# Patient Record
Sex: Male | Born: 1980 | Race: Black or African American | Hispanic: No | Marital: Married | Smoking: Current every day smoker
Health system: Southern US, Community
[De-identification: ages and names within clinical notes are randomized; demographics above are authoritative.]

---
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
## 2002-03-07 ENCOUNTER — Emergency Department (HOSPITAL_COMMUNITY): Admission: EM | Admit: 2002-03-07 | Discharge: 2002-03-07 | Payer: Self-pay | Admitting: Emergency Medicine

## 2002-03-07 ENCOUNTER — Encounter: Payer: Self-pay | Admitting: Emergency Medicine

## 2009-07-10 ENCOUNTER — Ambulatory Visit: Payer: Self-pay | Admitting: Radiology

## 2009-07-10 ENCOUNTER — Emergency Department (HOSPITAL_BASED_OUTPATIENT_CLINIC_OR_DEPARTMENT_OTHER): Admission: EM | Admit: 2009-07-10 | Discharge: 2009-07-10 | Payer: Self-pay | Admitting: Emergency Medicine

## 2010-08-15 ENCOUNTER — Emergency Department (HOSPITAL_BASED_OUTPATIENT_CLINIC_OR_DEPARTMENT_OTHER): Admission: EM | Admit: 2010-08-15 | Discharge: 2010-08-15 | Payer: Self-pay | Admitting: Emergency Medicine

## 2011-01-28 LAB — GC/CHLAMYDIA PROBE AMP, GENITAL: Chlamydia, DNA Probe: NEGATIVE

## 2011-02-18 IMAGING — CR DG FOOT COMPLETE 3+V*R*
3 series · 3 of 3 positions shown · non-contrast
Comparison: None available.

CLINICAL DATA: Lateral foot pain.  Injury.

RIGHT FOOT COMPLETE - 3+ VIEW

[t foot ap right]
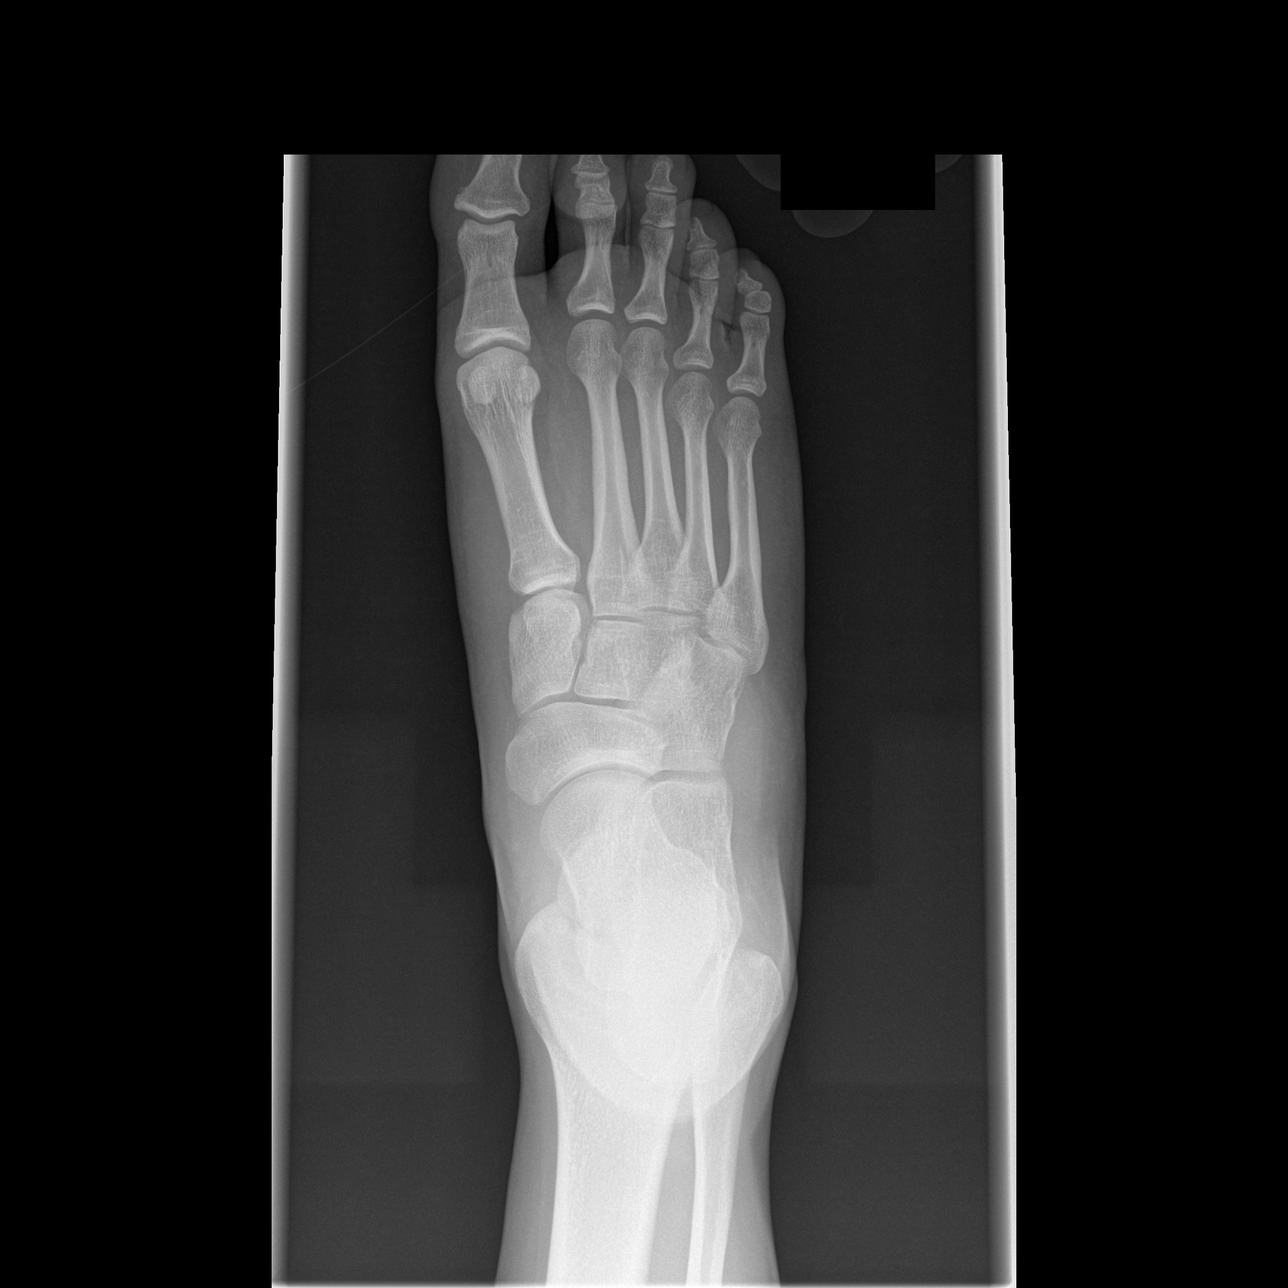

[t foot oblique right]
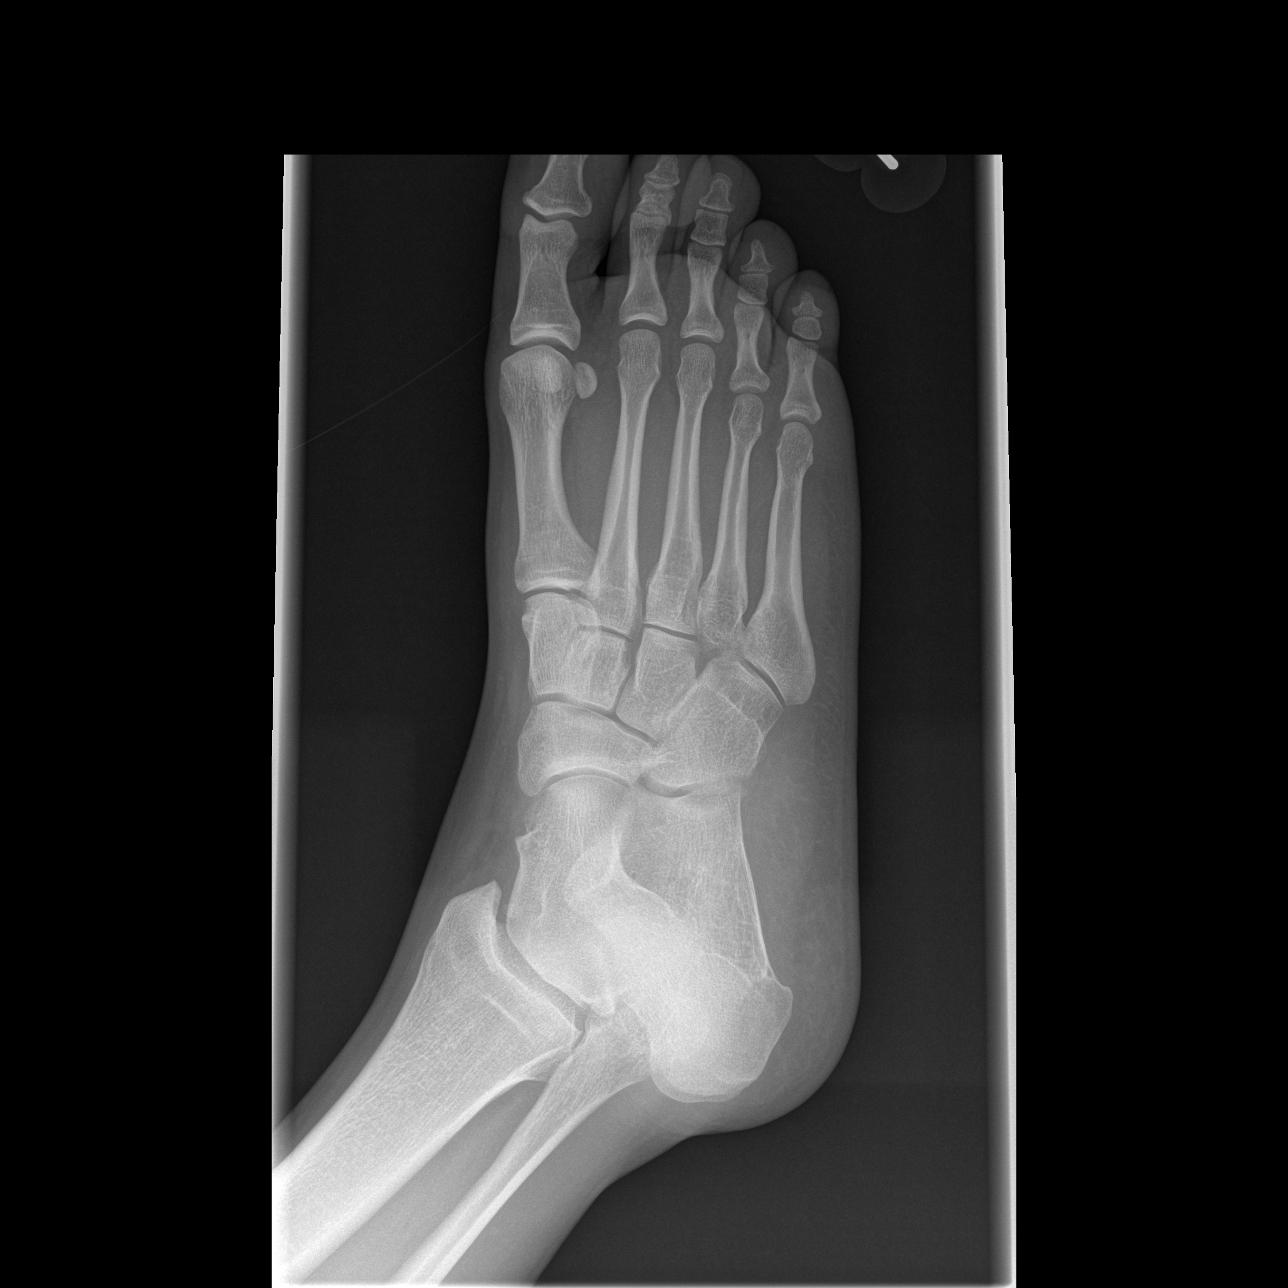

[t foot lat right]
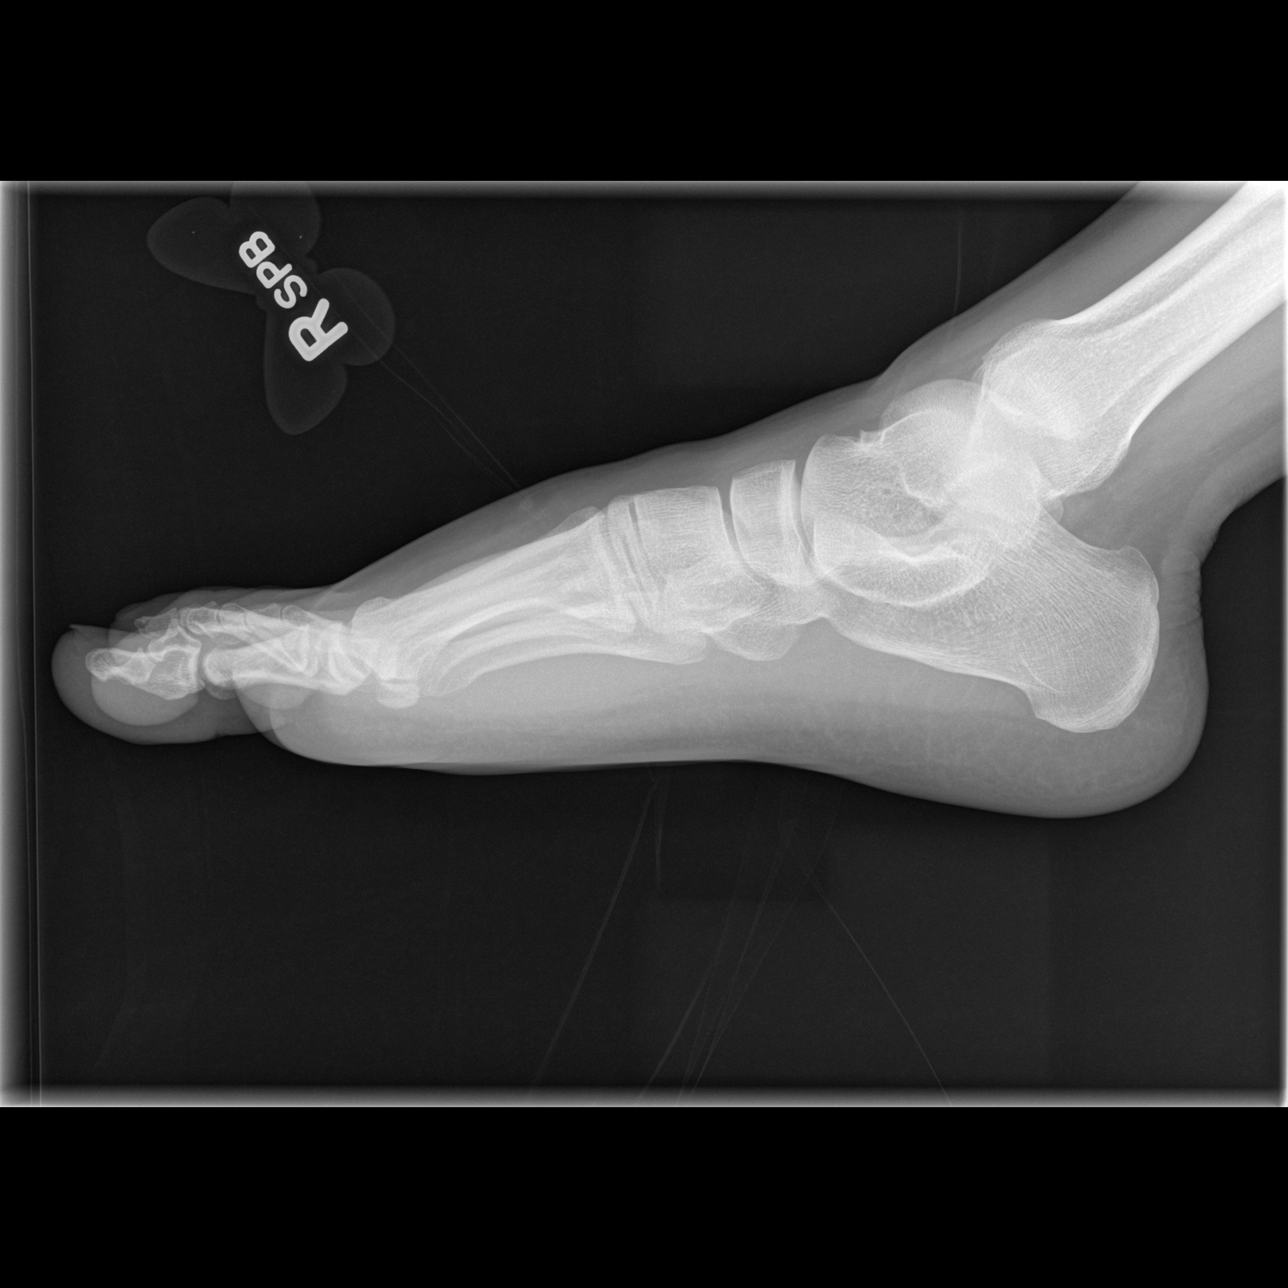

[3 of 3 positions shown; findings below may reference images not displayed]

FINDINGS: No acute bone or soft tissue abnormalities are present.
The ankle joint is located.
IMPRESSION: No acute abnormality.

## 2014-09-04 ENCOUNTER — Encounter (HOSPITAL_BASED_OUTPATIENT_CLINIC_OR_DEPARTMENT_OTHER): Payer: Self-pay | Admitting: Emergency Medicine

## 2014-09-04 ENCOUNTER — Emergency Department (HOSPITAL_BASED_OUTPATIENT_CLINIC_OR_DEPARTMENT_OTHER)
Admission: EM | Admit: 2014-09-04 | Discharge: 2014-09-04 | Disposition: A | Discharge status: Home / Self Care | Payer: Managed Care, Other (non HMO) | Attending: Emergency Medicine | Admitting: Emergency Medicine

## 2014-09-04 ENCOUNTER — Emergency Department (HOSPITAL_BASED_OUTPATIENT_CLINIC_OR_DEPARTMENT_OTHER): Payer: Managed Care, Other (non HMO)

## 2014-09-04 DIAGNOSIS — M25461 Effusion, right knee: Secondary | ICD-10-CM | POA: Insufficient documentation

## 2014-09-04 DIAGNOSIS — Z720 Tobacco use: Secondary | ICD-10-CM | POA: Insufficient documentation

## 2014-09-04 DIAGNOSIS — R52 Pain, unspecified: Secondary | ICD-10-CM

## 2014-09-04 DIAGNOSIS — M25561 Pain in right knee: Secondary | ICD-10-CM | POA: Diagnosis present

## 2014-09-04 LAB — SYNOVIAL CELL COUNT + DIFF, W/ CRYSTALS
Crystals, Fluid: NONE SEEN
Eosinophils-Synovial: 0 % (ref 0–1)
LYMPHOCYTES-SYNOVIAL FLD: 1 % (ref 0–20)
Monocyte-Macrophage-Synovial Fluid: 3 % — ABNORMAL LOW (ref 50–90)
Neutrophil, Synovial: 96 % — ABNORMAL HIGH (ref 0–25)
Other Cells-SYN: 0
WBC, Synovial: 6450 /mm3 — ABNORMAL HIGH (ref 0–200)

## 2014-09-04 MED ORDER — OXYCODONE-ACETAMINOPHEN 5-325 MG PO TABS
2.0000 | ORAL_TABLET | Freq: Once | ORAL | Status: AC
  Administered 2014-09-04: 2 via ORAL
  Filled 2014-09-04: qty 2

## 2014-09-04 MED ORDER — LIDOCAINE HCL 2 % IJ SOLN
10.0000 mL | Freq: Once | INTRAMUSCULAR | Status: DC
  Filled 2014-09-04: qty 20

## 2014-09-04 MED ORDER — OXYCODONE-ACETAMINOPHEN 5-325 MG PO TABS: 1.0000 | ORAL_TABLET | Freq: Four times a day (QID) | ORAL | Status: DC | PRN

## 2014-09-04 NOTE — ED Notes (Signed)
Woke up Monday morning - 2 days ago - with right knee pain.  No known injury.

## 2014-09-04 NOTE — Discharge Instructions (Signed)
Take percocet for severe pain only. No driving or operating heavy machinery while taking percocet. This medication may cause drowsiness. ° °Knee Effusion °The medical term for having fluid in your knee is effusion. This is often due to an internal derangement of the knee. This means something is wrong inside the knee. Some of the causes of fluid in the knee may be torn cartilage, a torn ligament, or bleeding into the joint from an injury. Your knee is likely more difficult to bend and move. This is often because there is increased pain and pressure in the joint. The time it takes for recovery from a knee effusion depends on different factors, including:  °· Type of injury. °· Your age. °· Physical and medical conditions. °· Rehabilitation Strategies. °How long you will be away from your normal activities will depend on what kind of knee problem you have and how much damage is present. Your knee has two types of cartilage. Articular cartilage covers the bone ends and lets your knee bend and move smoothly. Two menisci, thick pads of cartilage that form a rim inside the joint, help absorb shock and stabilize your knee. Ligaments bind the bones together and support your knee joint. Muscles move the joint, help support your knee, and take stress off the joint itself. °CAUSES  °Often an effusion in the knee is caused by an injury to one of the menisci. This is often a tear in the cartilage. Recovery after a meniscus injury depends on how much meniscus is damaged and whether you have damaged other knee tissue. Small tears may heal on their own with conservative treatment. Conservative means rest, limited weight bearing activity and muscle strengthening exercises. Your recovery may take up to 6 weeks.  °TREATMENT  °Larger tears may require surgery. Meniscus injuries may be treated during arthroscopy. Arthroscopy is a procedure in which your surgeon uses a small telescope like instrument to look in your knee. Your caregiver  can make a more accurate diagnosis (learning what is wrong) by performing an arthroscopic procedure. °If your injury is on the inner margin of the meniscus, your surgeon may trim the meniscus back to a smooth rim. In other cases your surgeon will try to repair a damaged meniscus with stitches (sutures). This may make rehabilitation take longer, but may provide better long term result by helping your knee keep its shock absorption capabilities. °Ligaments which are completely torn usually require surgery for repair. °HOME CARE INSTRUCTIONS °· Use crutches as instructed. °· If a brace is applied, use as directed. °· Once you are home, an ice pack applied to your swollen knee may help with discomfort and help decrease swelling. °· Keep your knee raised (elevated) when you are not up and around or on crutches. °· Only take over-the-counter or prescription medicines for pain, discomfort, or fever as directed by your caregiver. °· Your caregivers will help with instructions for rehabilitation of your knee. This often includes strengthening exercises. °· You may resume a normal diet and activities as directed. °SEEK MEDICAL CARE IF:  °· There is increased swelling in your knee. °· You notice redness, swelling, or increasing pain in your knee. °· An unexplained oral temperature above 102° F (38.9° C) develops. °SEEK IMMEDIATE MEDICAL CARE IF:  °· You develop a rash. °· You have difficulty breathing. °· You have any allergic reactions from medications you may have been given. °· There is severe pain with any motion of the knee. °MAKE SURE YOU:  °· Understand these   instructions.  Will watch your condition.  Will get help right away if you are not doing well or get worse. Document Released: 01/22/2004 Document Revised: 01/24/2012 Document Reviewed: 03/27/2008 [facility] Patient Information 2015 [place], [place]. This information is not intended to replace advice given to you by your health care provider. Make sure you  discuss any questions you have with your health care provider.  Knee Arthrocentesis Arthrocentesis is a procedure for removing fluid from a joint. The procedure is used to remove uncomfortable amounts of fluid from a joint or to get a sample of joint fluid for testing. Testing joint fluid can help your health care provider figure out the cause of the pain or swelling you are having in your joint. Infection or gout, among other conditions, can cause fluid to form in joints, resulting in pain or swelling.  LET YOUR CAREGIVER KNOW ABOUT:   Allergies.  Medications taken including herbs, eye drops, over the counter medications, and creams.  Use of steroids (by mouth or creams).  Previous problems with anesthetics or Novocaine.  History of blood clots (thrombophlebitis).  History of bleeding or blood problems.  Previous surgery.  Other health problems. RISKS AND COMPLICATIONS  A local anesthetic may not numb the area well enough and you may feel some minor discomfort. In rare cases, you may have an allergic reaction to the drug used to numb the skin.  More fluid may form in the joint.  You may develop infection or bleeding. BEFORE THE PROCEDURE  Wash all of the skin around the entire knee area. Try to remove any loose, scaling skin. There is no other specific preparation necessary unless advised otherwise by your caregiver. AFTER THE PROCEDURE   You can go home after the procedure.  You may need to put ice on the joint 15-20 minutes, 03-04 times a day until pain goes away.  You may need to put an elastic bandage on the joint. HOME CARE INSTRUCTIONS   Only take over-the-counter or prescription medicines for pain, discomfort, or fever as directed by your caregiver.  You should avoid stressing the joint. Unless advised otherwise, this includes jogging, bicycling, recreational climbing, hiking, and other activities that would put a lot of pressure on a knee joint.  Laying down and  elevating the leg/knee above the level of your heart can help to minimize return of swelling. SEEK MEDICAL CARE IF:   You have repeated or worsening swelling.  There is drainage from the puncture area.  You develop red streaking that extends above or below the site where the needle had been placed. SEEK IMMEDIATE MEDICAL CARE IF:   You develop a fever.  You have pain that gets worse even though you are taking pain medicine.  The area is red and warm and you have trouble moving the joint. MAKE SURE YOU:   Understand these instructions.  Will watch your condition.  Will get help right away if you are not doing well or get worse. Document Released: 01/20/2007 Document Revised: 01/24/2012 Document Reviewed: 10/16/2007 [facility]Exit[facility] Patient Information 2015 [place], [place]. This information is not intended to replace advice given to you by your health care provider. Make sure you discuss any questions you have with your health care provider.

## 2014-09-04 NOTE — ED Notes (Signed)
Patient transported to [facility] 

## 2014-09-04 NOTE — ED Provider Notes (Signed)
Medical screening examination/treatment/procedure(s) were performed by non-physician practitioner and as supervising physician I was immediately available for consultation/collaboration.   Warnell Foresterrey Ella Guillotte, MD 09/04/14 [phone]

## 2014-09-04 NOTE — ED Provider Notes (Signed)
CSN: [id number]     Arrival date & time 09/04/14  1833 History   First MD Initiated Contact with Patient 09/04/14 1842     Chief Complaint  Patient presents with  . Knee Pain     (Consider location/radiation/quality/duration/timing/severity/associated sxs/prior Treatment) HPI Comments: This is a 33 year old male who presents to the emergency department complaining of right knee pain x2 days. Patient reports 2 mornings ago he woke up with pain. Pain worse with movement and walking. He has not tried any alleviating factors for his symptoms. No known injury or trauma. Denies ever injuring his right knee in the past. Denies fever, chills, rash or skin color change.  Patient is a 33 y.o. male presenting with knee pain. The history is provided by the patient.  Knee Pain Associated symptoms: no fever     History reviewed. No pertinent past medical history. History reviewed. No pertinent past surgical history. No family history on file. History  Substance Use Topics  . Smoking status: Current Every Day Smoker -- 0.50 packs/day  . Smokeless tobacco: Not on file  . Alcohol Use: No    Review of Systems  Constitutional: Negative for fever.  HENT: Negative.   Respiratory: Negative.   Cardiovascular: Negative.   Gastrointestinal: Negative for nausea.  Musculoskeletal: Positive for joint swelling.       + R knee pain.  Skin: Negative for color change.      Allergies  Review of patient's allergies indicates no known allergies.  Home Medications   Prior to Admission medications   Medication Sig Start Date End Date Taking? Authorizing Provider  oxyCODONE-acetaminophen (PERCOCET) 5-325 MG per tablet Take 1-2 tablets by mouth every 6 (six) hours as needed for severe pain. 09/04/14   Sharda Keddy M Marlyss Cissell, PA-C   BP 154/97  Pulse 66  Temp(Src) 98.4 F (36.9 C) (Oral)  Resp 18  SpO2 100% Physical Exam  Nursing note and vitals reviewed. Constitutional: He is oriented to person, place, and  time. He appears well-developed and well-nourished. No distress.  HENT:  Head: Normocephalic and atraumatic.  Eyes: Conjunctivae and EOM are normal.  Neck: Normal range of motion. Neck supple.  Cardiovascular: Normal rate, regular rhythm and normal heart sounds.   Pulmonary/Chest: Effort normal and breath sounds normal.  Musculoskeletal:  Right knee- tender to palpation medial to the patella with mild swelling. No erythema or warmth. Full range of motion, pain with flexion. No ligamentous laxity.  Neurological: He is alert and oriented to person, place, and time.  Skin: Skin is warm and dry.  Psychiatric: He has a normal mood and affect. His behavior is normal.    ED Course  ARTHOCENTESIS Date/Time: 09/04/2014 7:12 PM Performed by: Kathrynn SpeedHESS, Mindy Gali M Authorized by: Kathrynn SpeedHESS, Rema Lievanos M Consent: Verbal consent obtained. Consent given by: patient Patient understanding: patient states understanding of the procedure being performed Indications: joint swelling and pain  Body area: knee Joint: right knee Local anesthesia used: yes Anesthesia: local infiltration Local anesthetic: lidocaine 2% without epinephrine Anesthetic total: 1 ml Patient sedated: no Preparation: Patient was prepped and draped in the usual sterile fashion. Needle gauge: 18 G Ultrasound guidance: no Approach: medial Aspirate: bloody Aspirate amount: 4 ml Lidocaine 2% amount: 4 ml   (including critical care time) Labs Review Labs Reviewed  BODY FLUID CULTURE  SYNOVIAL CELL COUNT + DIFF, W/ CRYSTALS    Imaging Review Dg Knee 1-2 Views Right  09/04/2014   CLINICAL DATA:  33 year old male who awoke with acute right knee pain.  No known injury. Initial encounter.  EXAM: RIGHT KNEE - 1-2 VIEW  COMPARISON:  None.  FINDINGS: Moderate suprapatellar joint effusion. Dystrophic suprapatellar joint space calcifications (lateral view). Patella intact. No acute fracture or dislocation. Other tiny calcific foci projected in the medial  and lateral compartments on the frontal view.  IMPRESSION: Joint effusion and multiple calcified loose bodies suspected. No acute fracture or dislocation identified about the right knee.   Electronically Signed   By: Augusto GambleLee  Hall M.D.   On: 09/04/2014 19:02     EKG Interpretation None      MDM   Final diagnoses:  Knee effusion, right   Patient with knee pain and joint effusion. Afebrile, vital signs stable. He is in no apparent distress. No erythema or warmth concerning for infection. Doubt gout. Bloody joint fluid withdrawn. Possible ligamentous injury. Knee immobilizer applied, crutches given. Will discharge with pain medication. Followup with orthopedics. Stable for discharge. Return precautions given. Parent states understanding of plan and is agreeable.  Kathrynn SpeedRobyn M Kyheem Bathgate, PA-C 09/04/14 [phone]

## 2014-09-06 ENCOUNTER — Other Ambulatory Visit: Payer: Self-pay | Admitting: Family Medicine

## 2014-09-06 ENCOUNTER — Encounter: Payer: Self-pay | Admitting: Family Medicine

## 2014-09-06 ENCOUNTER — Ambulatory Visit (INDEPENDENT_AMBULATORY_CARE_PROVIDER_SITE_OTHER): Payer: Managed Care, Other (non HMO) | Admitting: Family Medicine

## 2014-09-06 VITALS — BP 145/92 | HR 76 | Ht 66.0 in | Wt 170.0 lb

## 2014-09-06 DIAGNOSIS — M25561 Pain in right knee: Secondary | ICD-10-CM

## 2014-09-06 DIAGNOSIS — M25461 Effusion, right knee: Secondary | ICD-10-CM

## 2014-09-06 MED ORDER — METHYLPREDNISOLONE ACETATE 40 MG/ML IJ SUSP
40.0000 mg | Freq: Once | INTRAMUSCULAR | Status: AC
  Administered 2014-09-06: 40 mg via INTRA_ARTICULAR

## 2014-09-06 MED ORDER — HYDROCODONE-ACETAMINOPHEN 5-325 MG PO TABS: 1.0000 | ORAL_TABLET | Freq: Four times a day (QID) | ORAL | Status: AC | PRN

## 2014-09-06 NOTE — Patient Instructions (Signed)
You have a knee effusion. We aspirated and injected your knee today. Ice knee 15 minutes at a time 3-4 times a day. Ibuprofen 600mg  three times a day OR aleve 2 tabs twice a day with food for pain and inflammation. Norco every 6 hours as needed for severe pain. Knee brace as needed for support. Crutches if needed. Call me in 1-2 weeks to let me know how you're doing.

## 2014-09-07 LAB — SYNOVIAL CELL COUNT + DIFF, W/ CRYSTALS
Crystals, Fluid: NONE SEEN
Eosinophils-Synovial: 0 % (ref 0–1)
Lymphocytes-Synovial Fld: 5 % (ref 0–20)
Monocyte/Macrophage: 18 % — ABNORMAL LOW (ref 50–90)
NEUTROPHIL, SYNOVIAL: 77 % — AB (ref 0–25)
WBC, Synovial: 4065 cu mm — ABNORMAL HIGH (ref 0–200)

## 2014-09-08 LAB — BODY FLUID CULTURE
Culture: NO GROWTH
SPECIAL REQUESTS: NORMAL

## 2014-09-11 ENCOUNTER — Encounter: Payer: Self-pay | Admitting: Family Medicine

## 2014-09-11 DIAGNOSIS — M25561 Pain in right knee: Secondary | ICD-10-CM | POA: Insufficient documentation

## 2014-09-11 NOTE — Progress Notes (Signed)
Patient ID: Kirk White, male   DOB: December 24, 1980, 33 y.o.   MRN: [id number]  [facility]  Subjective:   HPI: Patient is a 33 y.o. male here for right knee swelling.  Patient reports he's previously had issues with swelling, pain in right knee that comes and goes. No other joints involved. Current issue started 10/20 when he woke up with right knee swelling and pain. No known injury. Came to ED and had radiographs showing an effusion, probable loose bodies in knee. Had knee drained - reports this was medial and only able to obtain 4mL of fluid. Analysis showed no crystals but 6450 WBCs with 96% PMNs. No catching or locking of knee. No remote injury.  History reviewed. No pertinent past medical history.  No current outpatient prescriptions on file prior to visit.   No current facility-administered medications on file prior to visit.    History reviewed. No pertinent past surgical history.  No Known Allergies  History   Social History  . Marital Status: Married    Spouse Name: N/A    Number of Children: N/A  . Years of Education: N/A   Occupational History  . Not on file.   Social History Main Topics  . Smoking status: Current Every Day Smoker -- 0.50 packs/day  . Smokeless tobacco: Not on file  . Alcohol Use: No  . Drug Use: No  . Sexual Activity: Not on file   Other Topics Concern  . Not on file   Social History Narrative  . No narrative on file    No family history on file.  BP 145/92  Pulse 76  Ht 5\' 6"  (1.676 m)  Wt 170 lb (77.111 kg)  BMI 27.45 kg/m2  Review of Systems: See HPI above.    Objective:  Physical Exam:  Gen: NAD  Right knee: Mod effusion.  No bruising, other deformity. TTP lateral suprapatellar area, some medial joint line.  No other tenderness. FROM. Negative ant/post drawers. Negative valgus/varus testing. Negative lachmanns. Negative mcmurrays, apleys, patellar apprehension. NV intact distally.    Assessment &  Plan:  1. Right knee pain/effusion - Radiographs showed multiple probable loose bodies though he does not recall an injury.  His aspiration was likely unsuccessful - we repeated this today superolaterally and obtained 38mL of fluid - will send to lab.  Followed this with a cortisone injection.  Icing, nsaids with norco as needed.  Will call him with fluid results.  If improves quite a bit with cortisone injection would suspect this is an inflammatory arthropathy.  If not, he will likely need arthroscopy to try to remove these loose bodies.  Call us in 1-2 weeks to update us on how he is doing.  Knee brace for support with crutches as needed.  After informed written consent patient was lying supine on exam table.  Right knee was prepped with alcohol swab.  Utilizing superolateral approach under ultrasound guidance, 3 mL of marcaine was used for local anesthesia.  Then using an 18g needle on 60cc syringe, 38 mL of red colored fluid was aspirated from right knee.  Knee was then injected with 3:1 marcaine:depomedrol.  Patient tolerated procedure well without immediate complications.

## 2014-09-11 NOTE — Assessment & Plan Note (Signed)
Radiographs showed multiple probable loose bodies though he does not recall an injury.  His aspiration was likely unsuccessful - we repeated this today superolaterally and obtained 38mL of fluid - will send to lab.  Followed this with a cortisone injection.  Icing, nsaids with norco as needed.  Will call him with fluid results.  If improves quite a bit with cortisone injection would suspect this is an inflammatory arthropathy.  If not, he will likely need arthroscopy to try to remove these loose bodies.  Call us in 1-2 weeks to update us on how he is doing.  Knee brace for support with crutches as needed.  After informed written consent patient was lying supine on exam table.  Right knee was prepped with alcohol swab.  Utilizing superolateral approach under ultrasound guidance, 3 mL of marcaine was used for local anesthesia.  Then using an 18g needle on 60cc syringe, 38 mL of red colored fluid was aspirated from right knee.  Knee was then injected with 3:1 marcaine:depomedrol.  Patient tolerated procedure well without immediate complications.

## 2016-04-14 IMAGING — CR DG KNEE 1-2V*R*
2 series · 2 of 2 positions shown · non-contrast
Comparison: None.

CLINICAL DATA: 33-year-old male who awoke with acute right knee
pain. No known injury. Initial encounter.

EXAM:
RIGHT KNEE - 1-2 VIEW

[t knee ap right]
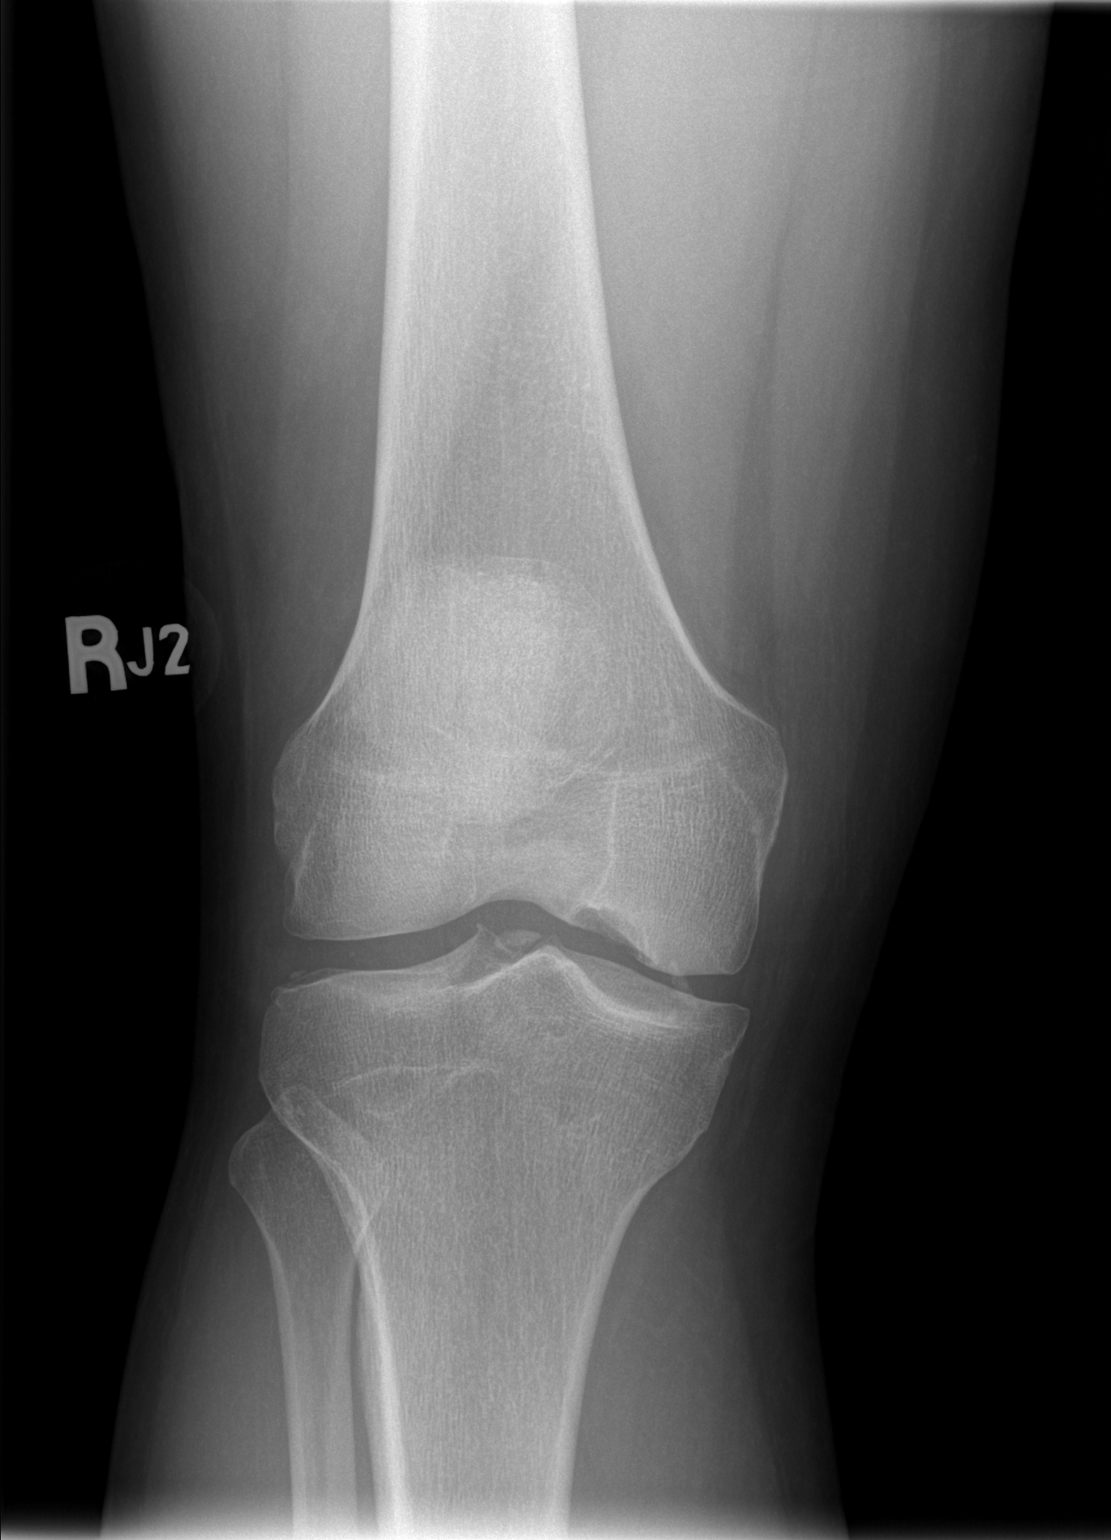

[t knee lat right]
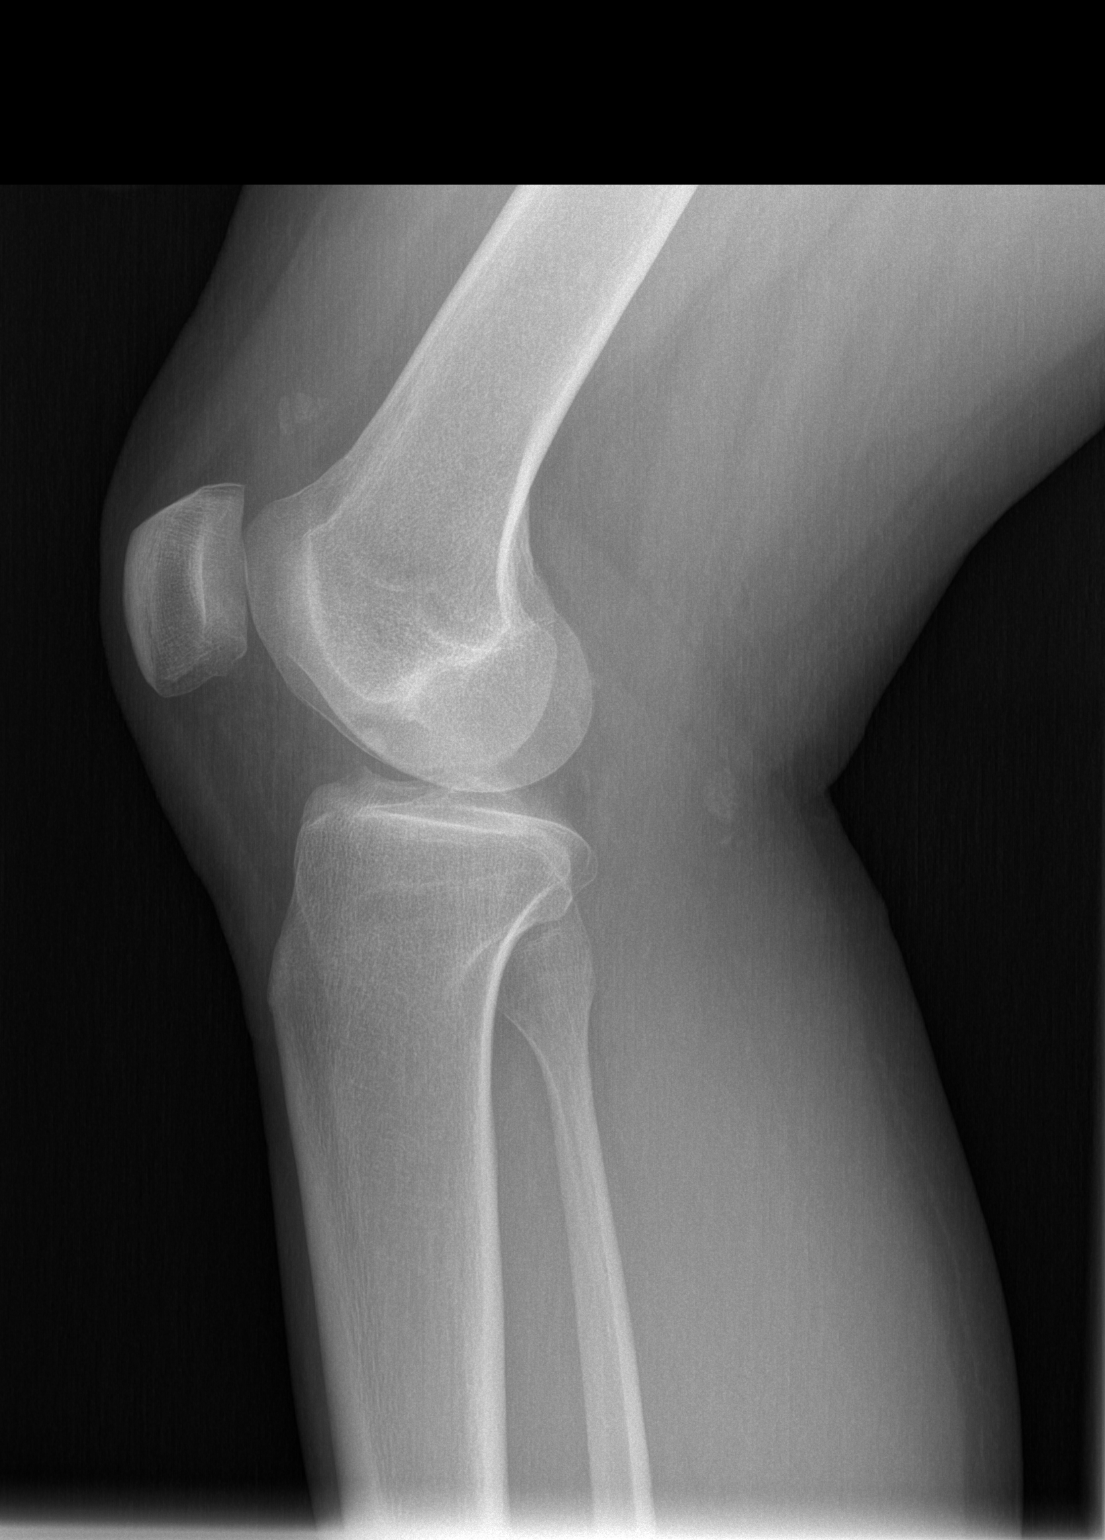

[2 of 2 positions shown; findings below may reference images not displayed]

FINDINGS: Moderate suprapatellar joint effusion. Dystrophic suprapatellar
joint space calcifications (lateral view). Patella intact. No acute
fracture or dislocation. Other tiny calcific foci projected in the
medial and lateral compartments on the frontal view.
IMPRESSION: Joint effusion and multiple calcified loose bodies suspected. No
acute fracture or dislocation identified about the right knee.
